# Patient Record
Sex: Male | Born: 1980 | Race: Black or African American | Hispanic: No | Marital: Married | Smoking: Never smoker
Health system: Southern US, Community
[De-identification: ages and names within clinical notes are randomized; demographics above are authoritative.]

---

## 2018-02-03 DIAGNOSIS — R0789 Other chest pain: Secondary | ICD-10-CM | POA: Insufficient documentation

## 2018-02-03 DIAGNOSIS — R030 Elevated blood-pressure reading, without diagnosis of hypertension: Secondary | ICD-10-CM | POA: Insufficient documentation

## 2018-02-03 DIAGNOSIS — J45909 Unspecified asthma, uncomplicated: Secondary | ICD-10-CM | POA: Insufficient documentation

## 2018-04-28 DIAGNOSIS — R9431 Abnormal electrocardiogram [ECG] [EKG]: Secondary | ICD-10-CM | POA: Insufficient documentation

## 2018-04-28 DIAGNOSIS — E559 Vitamin D deficiency, unspecified: Secondary | ICD-10-CM | POA: Insufficient documentation

## 2018-04-28 DIAGNOSIS — E538 Deficiency of other specified B group vitamins: Secondary | ICD-10-CM | POA: Insufficient documentation

## 2018-04-28 DIAGNOSIS — R001 Bradycardia, unspecified: Secondary | ICD-10-CM | POA: Insufficient documentation

## 2019-10-24 ENCOUNTER — Other Ambulatory Visit: Payer: Self-pay | Admitting: Internal Medicine

## 2019-10-24 DIAGNOSIS — E0590 Thyrotoxicosis, unspecified without thyrotoxic crisis or storm: Secondary | ICD-10-CM

## 2019-11-02 ENCOUNTER — Ambulatory Visit
Admission: RE | Admit: 2019-11-02 | Discharge: 2019-11-02 | Disposition: A | Discharge status: Home / Self Care | Payer: 59 | Source: Ambulatory Visit | Attending: Internal Medicine | Admitting: Internal Medicine

## 2019-11-02 DIAGNOSIS — E0590 Thyrotoxicosis, unspecified without thyrotoxic crisis or storm: Secondary | ICD-10-CM

## 2019-12-28 ENCOUNTER — Other Ambulatory Visit: Payer: Self-pay

## 2019-12-28 ENCOUNTER — Encounter: Payer: Self-pay | Admitting: Endocrinology

## 2019-12-28 ENCOUNTER — Ambulatory Visit (INDEPENDENT_AMBULATORY_CARE_PROVIDER_SITE_OTHER): Payer: 59 | Admitting: Endocrinology

## 2019-12-28 DIAGNOSIS — E0590 Thyrotoxicosis, unspecified without thyrotoxic crisis or storm: Secondary | ICD-10-CM | POA: Diagnosis not present

## 2019-12-28 LAB — T4, FREE: Free T4: 0.89 ng/dL (ref 0.60–1.60)

## 2019-12-28 LAB — TSH: TSH: 0.58 u[IU]/mL (ref 0.35–4.50)

## 2019-12-28 NOTE — Progress Notes (Signed)
Subjective:    Patient ID: Brian Santana, male    DOB: 1980/04/21, 39 y.o.   MRN: [id number]  HPI Pt is referred by Dr. Corky Downs, for hyperthyroidism.  Pt reports he was dx'ed with hyperthyroidism in 2021.  He has never been on therapy for this.  He has never had XRT to the anterior neck, or thyroid surgery.   He does not consume kelp or any other non-prescribed thyroid medication.  He has never been on amiodarone.  He has lost 15 lbs x 6 mos--unintentional.  He also has frequent BM's.    Social History   Socioeconomic History  . Marital status: Married    Spouse name: Not on file  . Number of children: Not on file  . Years of education: Not on file  . Highest education level: Not on file  Occupational History  . Not on file  Tobacco Use  . Smoking status: Never Smoker  . Smokeless tobacco: Never Used  Substance and Sexual Activity  . Alcohol use: Not on file  . Drug use: Not on file  . Sexual activity: Not on file  Other Topics Concern  . Not on file  Social History Narrative  . Not on file   Social Determinants of Health   Financial Resource Strain:   . Difficulty of Paying Living Expenses: Not on file  Food Insecurity:   . Worried About Programme researcher, broadcasting/film/video in the Last Year: Not on file  . Ran Out of Food in the Last Year: Not on file  Transportation Needs:   . Lack of Transportation (Medical): Not on file  . Lack of Transportation (Non-Medical): Not on file  Physical Activity:   . Days of Exercise per Week: Not on file  . Minutes of Exercise per Session: Not on file  Stress:   . Feeling of Stress : Not on file  Social Connections:   . Frequency of Communication with Friends and Family: Not on file  . Frequency of Social Gatherings with Friends and Family: Not on file  . Attends Religious Services: Not on file  . Active Member of Clubs or Organizations: Not on file  . Attends Banker Meetings: Not on file  . Marital Status: Not on file  Intimate  Partner Violence:   . Fear of Current or Ex-Partner: Not on file  . Emotionally Abused: Not on file  . Physically Abused: Not on file  . Sexually Abused: Not on file    Current Outpatient Medications on File Prior to Visit  Medication Sig Dispense Refill  . Ascorbic Acid (VITAMIN C PO) Take by mouth daily.    Marland Kitchen VITAMIN D PO Take by mouth daily.     No current facility-administered medications on file prior to visit.    No Known Allergies  Family History  Problem Relation Age of Onset  . Thyroid disease Neg Hx     BP 138/88   Pulse 62   Ht 5' 10.5" (1.791 m)   Wt 188 lb (85.3 kg)   SpO2 99%   BMI 26.59 kg/m   Review of Systems denies palpitations, sob, excessive diaphoresis, tremor, anxiety, and heat intolerance.      Objective:   Physical Exam VS: see vs page GEN: no distress HEAD: head: no deformity eyes: no periorbital swelling, no proptosis external nose and ears are normal NECK: supple, thyroid is not enlarged CHEST WALL: no deformity LUNGS: clear to auscultation CV: reg rate and rhythm, no murmur.  MUSCULOSKELETAL: gait is normal and steady EXTEMITIES: no deformity.  no leg edema NEURO:  cn 2-12 grossly intact.   readily moves all 4's.  sensation is intact to touch on all 4's. Slight fine tremor of the hands.   SKIN:  Normal texture and temperature.  No rash or suspicious lesion is visible.  Not diaphoretic.   NODES:  None palpable at the neck PSYCH: alert, well-oriented.  Does not appear anxious nor depressed.    [place] (2021): Moderately heterogeneous, borderline enlarged and potentially mildly hyperemic thyroid without worrisome thyroid nodule or mass, nonspecific though could be seen in the setting of provided history of thyrotoxicosis.  I have reviewed outside records, and summarized: Pt was noted to have suppressed TSH, and referred here.  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  We discussed rx options.  He chooses tapazole   Patient Instructions  Blood tests are requested for you today.  We'll let you know about the results.  If it is overactive again. I'll prescirbe for you a medication to sloe it to normal If ever you have fever while taking methimazole, stop it and call us, even if the reason is obvious, because of the risk of a rare side-effect. It is best to never miss the medication.  However, if you do miss it, next best is to double up the next time.  Please come back for a follow-up appointment in 3 months.        Hyperthyroidism  Hyperthyroidism is when the thyroid gland is too active (overactive). The thyroid gland is a small gland located in the lower front part of the neck, just in front of the windpipe (trachea). This gland makes hormones that help control how the body uses food for energy (metabolism) as well as how the heart and brain function. These hormones also play a role in keeping your bones strong. When the thyroid is overactive, it produces too much of a hormone called thyroxine. What are the causes? This condition may be caused by:  Graves' disease. This is a disorder in which the body's disease-fighting system (immune system) attacks the thyroid gland. This is the most common cause.  Inflammation of the thyroid gland.  A tumor in the thyroid gland.  Use of certain medicines, including: ? Prescription thyroid hormone replacement. ? Herbal supplements that mimic thyroid hormones. ? Amiodarone therapy.  Solid or fluid-filled lumps within your thyroid gland (thyroid nodules).  Taking in a large amount of iodine from foods or medicines. What increases the risk? You are more likely to develop this condition if:  You are male.  You have a family history of thyroid conditions.  You smoke tobacco.  You use a medicine called lithium.  You take medicines that affect the immune system  (immunosuppressants). What are the signs or symptoms? Symptoms of this condition include:  Nervousness.  Inability to tolerate heat.  Unexplained weight loss.  Diarrhea.  Change in the texture of hair or skin.  Heart skipping beats or making extra beats.  Rapid heart rate.  Loss of menstruation.  Shaky hands.  Fatigue.  Restlessness.  Sleep problems.  Enlarged thyroid gland or a lump in the thyroid (nodule). You may also have symptoms of Graves' disease, which may include:  Protruding eyes.  Dry eyes.  Red or swollen eyes.  Problems with vision. How is this diagnosed? This condition  may be diagnosed based on:  Your symptoms and medical history.  A physical exam.  Blood tests.  Thyroid ultrasound. This test involves using sound waves to produce images of the thyroid gland.  A thyroid scan. A radioactive substance is injected into a vein, and images show how much iodine is present in the thyroid.  Radioactive iodine uptake test (RAIU). A small amount of radioactive iodine is given by mouth to see how much iodine the thyroid absorbs after a certain amount of time. How is this treated? Treatment depends on the cause and severity of the condition. Treatment may include:  Medicines to reduce the amount of thyroid hormone your body makes.  Radioactive iodine treatment (radioiodine therapy). This involves swallowing a small dose of radioactive iodine, in capsule or liquid form, to kill thyroid cells.  Surgery to remove part or all of your thyroid gland. You may need to take thyroid hormone replacement medicine for the rest of your life after thyroid surgery.  Medicines to help manage your symptoms. Follow these instructions at home:   Take over-the-counter and prescription medicines only as told by your health care provider.  Do not use any products that contain nicotine or tobacco, such as cigarettes and e-cigarettes. If you need help quitting, ask your  health care provider.  Follow any instructions from your health care provider about diet. You may be instructed to limit foods that contain iodine.  Keep all follow-up visits as told by your health care provider. This is important. ? You will need to have blood tests regularly so that your health care provider can monitor your condition. Contact a health care provider if:  Your symptoms do not get better with treatment.  You have a fever.  You are taking thyroid hormone replacement medicine and you: ? Have symptoms of depression. ? Feel like you are tired all the time. ? Gain weight. Get help right away if:  You have chest pain.  You have decreased alertness or a change in your awareness.  You have abdominal pain.  You feel dizzy.  You have a rapid heartbeat.  You have an irregular heartbeat.  You have difficulty breathing. Summary  The thyroid gland is a small gland located in the lower front part of the neck, just in front of the windpipe (trachea).  Hyperthyroidism is when the thyroid gland is too active (overactive) and produces too much of a hormone called thyroxine.  The most common cause is Graves' disease, a disorder in which your immune system attacks the thyroid gland.  Hyperthyroidism can cause various symptoms, such as unexplained weight loss, nervousness, inability to tolerate heat, or changes in your heartbeat.  Treatment may include medicine to reduce the amount of thyroid hormone your body makes, radioiodine therapy, surgery, or medicines to manage symptoms. This information is not intended to replace advice given to you by your health care provider. Make sure you discuss any questions you have with your health care provider. Document Revised: 02/04/2017 Document Reviewed: 02/02/2017 Elsevier Patient Education  2020 [employer].

## 2019-12-28 NOTE — Patient Instructions (Addendum)
Blood tests are requested for you today.  We'll let you know about the results.  If it is overactive again. I'll prescirbe for you a medication to sloe it to normal If ever you have fever while taking methimazole, stop it and call us, even if the reason is obvious, because of the risk of a rare side-effect. It is best to never miss the medication.  However, if you do miss it, next best is to double up the next time.  Please come back for a follow-up appointment in 3 months.        Hyperthyroidism  Hyperthyroidism is when the thyroid gland is too active (overactive). The thyroid gland is a small gland located in the lower front part of the neck, just in front of the windpipe (trachea). This gland makes hormones that help control how the body uses food for energy (metabolism) as well as how the heart and brain function. These hormones also play a role in keeping your bones strong. When the thyroid is overactive, it produces too much of a hormone called thyroxine. What are the causes? This condition may be caused by:  Graves' disease. This is a disorder in which the body's disease-fighting system (immune system) attacks the thyroid gland. This is the most common cause.  Inflammation of the thyroid gland.  A tumor in the thyroid gland.  Use of certain medicines, including: ? Prescription thyroid hormone replacement. ? Herbal supplements that mimic thyroid hormones. ? Amiodarone therapy.  Solid or fluid-filled lumps within your thyroid gland (thyroid nodules).  Taking in a large amount of iodine from foods or medicines. What increases the risk? You are more likely to develop this condition if:  You are male.  You have a family history of thyroid conditions.  You smoke tobacco.  You use a medicine called lithium.  You take medicines that affect the immune system (immunosuppressants). What are the signs or symptoms? Symptoms of this condition  include:  Nervousness.  Inability to tolerate heat.  Unexplained weight loss.  Diarrhea.  Change in the texture of hair or skin.  Heart skipping beats or making extra beats.  Rapid heart rate.  Loss of menstruation.  Shaky hands.  Fatigue.  Restlessness.  Sleep problems.  Enlarged thyroid gland or a lump in the thyroid (nodule). You may also have symptoms of Graves' disease, which may include:  Protruding eyes.  Dry eyes.  Red or swollen eyes.  Problems with vision. How is this diagnosed? This condition may be diagnosed based on:  Your symptoms and medical history.  A physical exam.  Blood tests.  Thyroid ultrasound. This test involves using sound waves to produce images of the thyroid gland.  A thyroid scan. A radioactive substance is injected into a vein, and images show how much iodine is present in the thyroid.  Radioactive iodine uptake test (RAIU). A small amount of radioactive iodine is given by mouth to see how much iodine the thyroid absorbs after a certain amount of time. How is this treated? Treatment depends on the cause and severity of the condition. Treatment may include:  Medicines to reduce the amount of thyroid hormone your body makes.  Radioactive iodine treatment (radioiodine therapy). This involves swallowing a small dose of radioactive iodine, in capsule or liquid form, to kill thyroid cells.  Surgery to remove part or all of your thyroid gland. You may need to take thyroid hormone replacement medicine for the rest of your life after thyroid surgery.  Medicines to help  manage your symptoms. Follow these instructions at home:   Take over-the-counter and prescription medicines only as told by your health care provider.  Do not use any products that contain nicotine or tobacco, such as cigarettes and e-cigarettes. If you need help quitting, ask your health care provider.  Follow any instructions from your health care provider about  diet. You may be instructed to limit foods that contain iodine.  Keep all follow-up visits as told by your health care provider. This is important. ? You will need to have blood tests regularly so that your health care provider can monitor your condition. Contact a health care provider if:  Your symptoms do not get better with treatment.  You have a fever.  You are taking thyroid hormone replacement medicine and you: ? Have symptoms of depression. ? Feel like you are tired all the time. ? Gain weight. Get help right away if:  You have chest pain.  You have decreased alertness or a change in your awareness.  You have abdominal pain.  You feel dizzy.  You have a rapid heartbeat.  You have an irregular heartbeat.  You have difficulty breathing. Summary  The thyroid gland is a small gland located in the lower front part of the neck, just in front of the windpipe (trachea).  Hyperthyroidism is when the thyroid gland is too active (overactive) and produces too much of a hormone called thyroxine.  The most common cause is Graves' disease, a disorder in which your immune system attacks the thyroid gland.  Hyperthyroidism can cause various symptoms, such as unexplained weight loss, nervousness, inability to tolerate heat, or changes in your heartbeat.  Treatment may include medicine to reduce the amount of thyroid hormone your body makes, radioiodine therapy, surgery, or medicines to manage symptoms. This information is not intended to replace advice given to you by your health care provider. Make sure you discuss any questions you have with your health care provider. Document Revised: 02/04/2017 Document Reviewed: 02/02/2017 Elsevier Patient Education  2020 [employer].

## 2019-12-31 ENCOUNTER — Telehealth: Payer: Self-pay

## 2019-12-31 NOTE — Telephone Encounter (Signed)
Informed patient of results and recommendations. 

## 2019-12-31 NOTE — Telephone Encounter (Signed)
-----   Message from Romero Belling, MD sent at 12/28/2019  5:53 PM EDT ----- please contact patient: Thyroid is back to normal, so we can hold off on the pill for now.  I'll see you next time.

## 2020-04-11 ENCOUNTER — Ambulatory Visit: Payer: 59 | Admitting: Endocrinology

## 2021-11-22 IMAGING — US US THYROID
1 series · 13 of 25 positions shown · non-contrast
Comparison: None.

CLINICAL DATA: Other.  Thyrotoxicosis without thyroid storm.

EXAM:
THYROID ULTRASOUND
TECHNIQUE: Ultrasound examination of the thyroid gland and adjacent soft
tissues was performed.

[Series 1: us thyroid · 0.05mm/px · 13 of 62 slices shown]
[im 1/62]
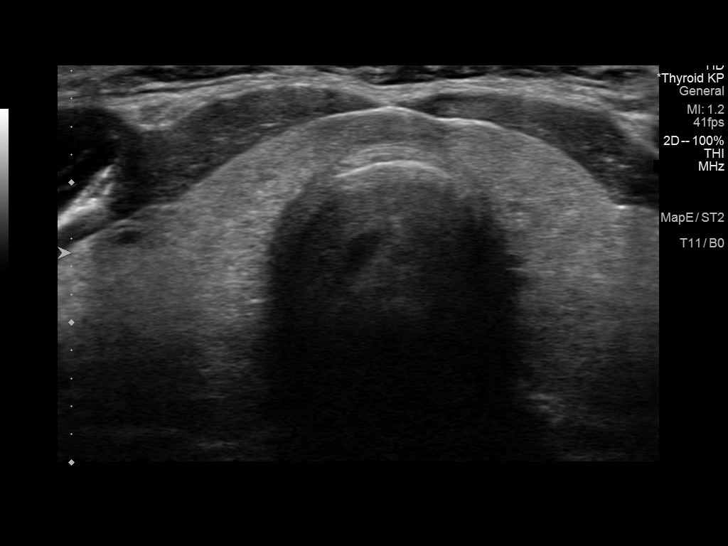
[im 6/62]
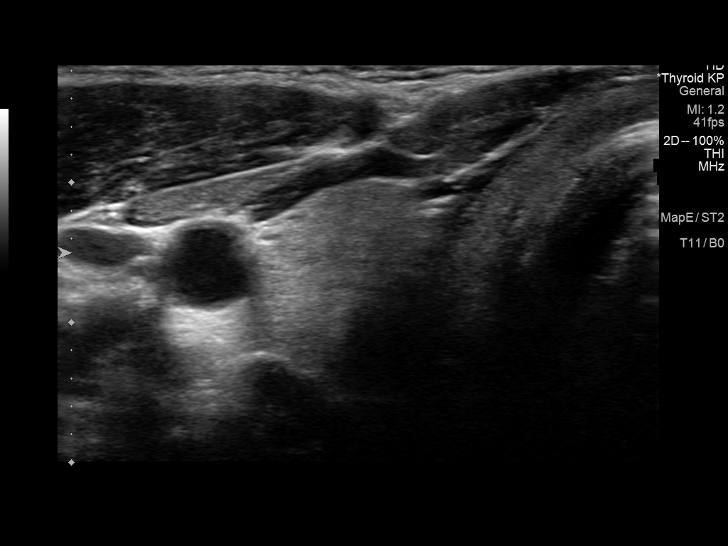
[im 11/62]
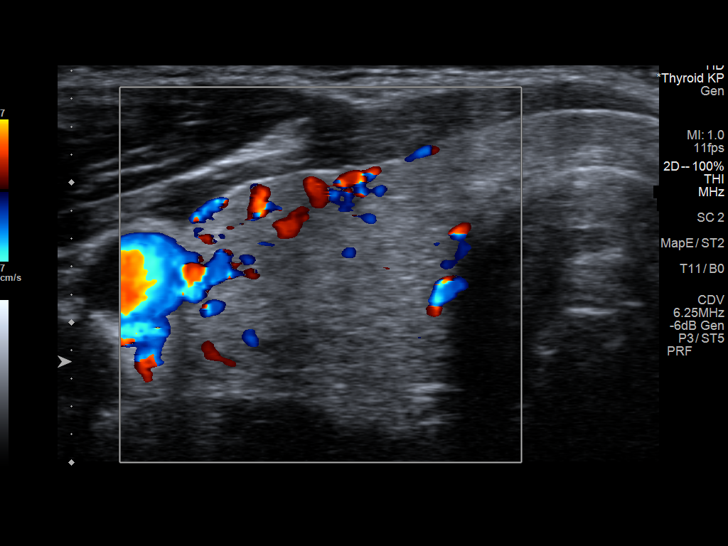
[im 16/62]
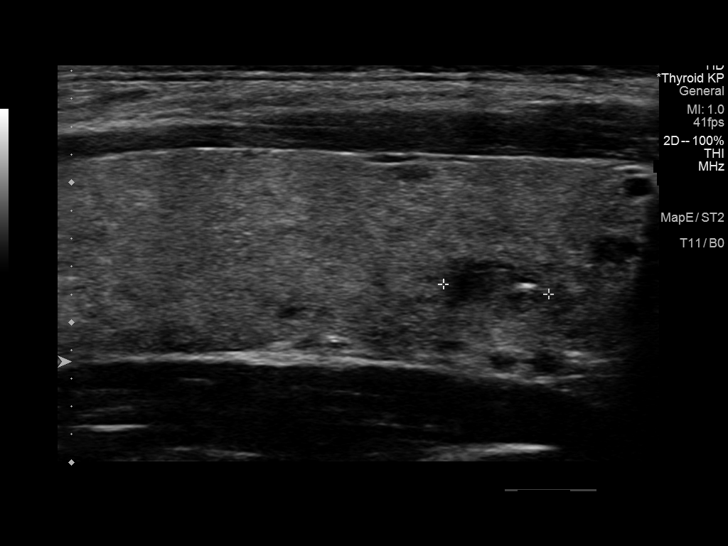
[im 21/62]
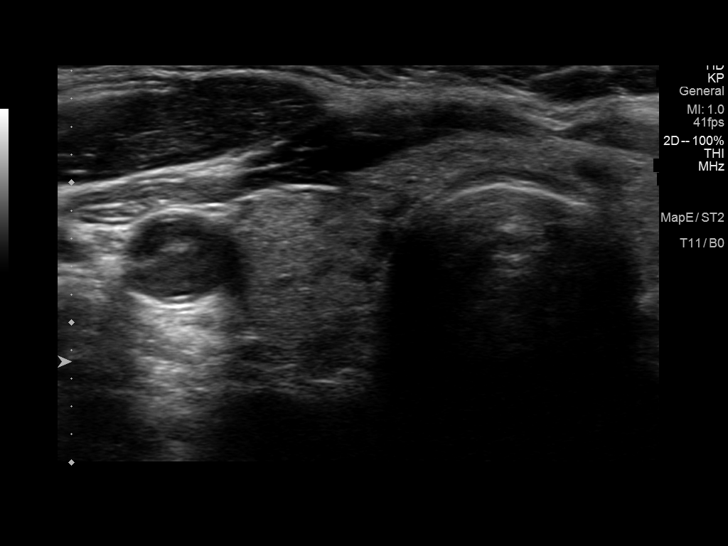
[im 26/62]
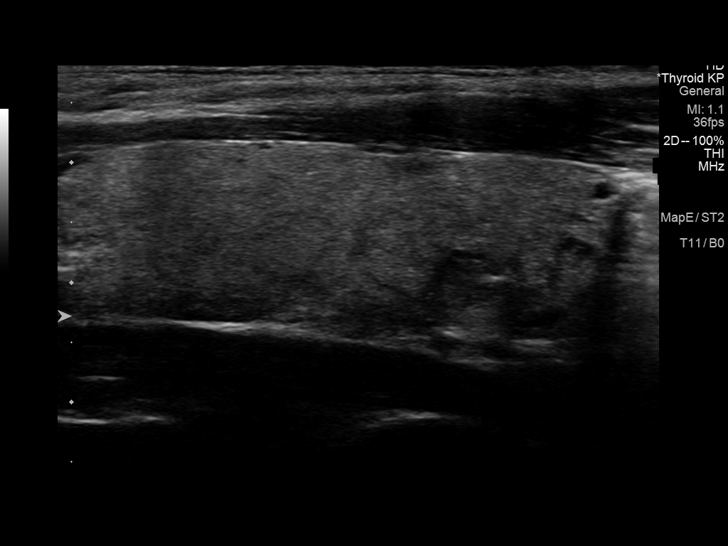
[im 31/62]
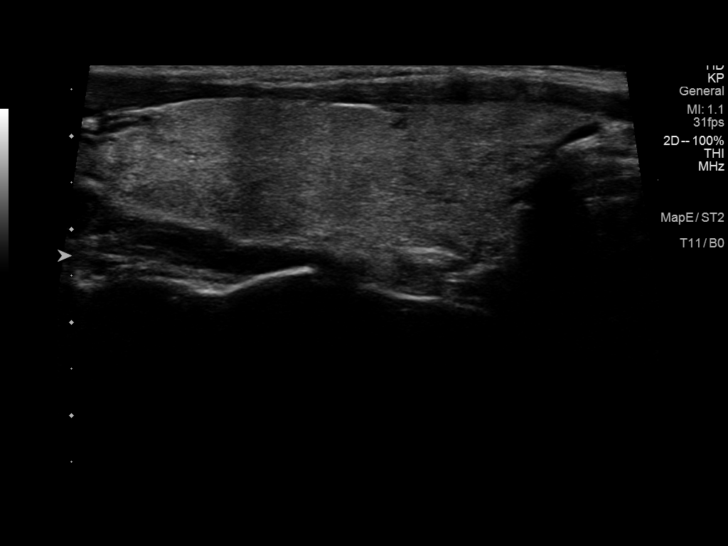
[im 36/62]
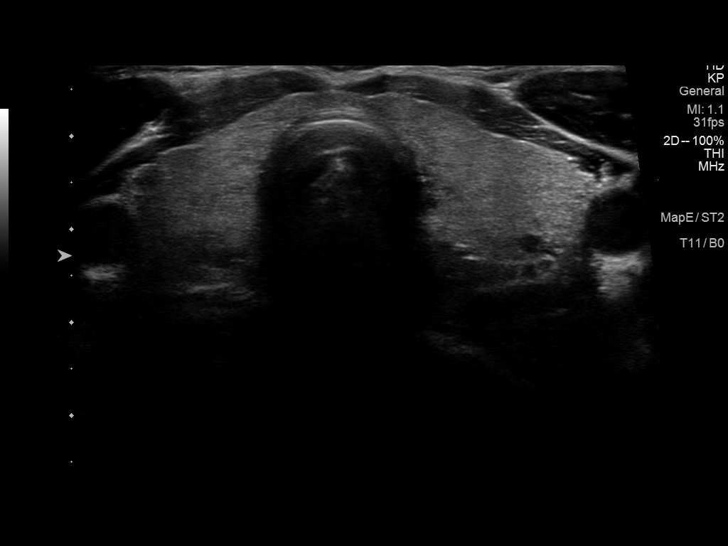
[im 41/62]
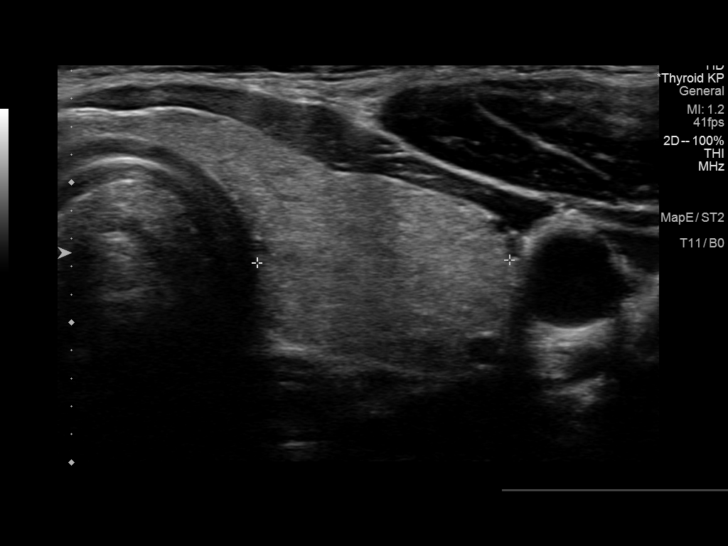
[im 46/62]
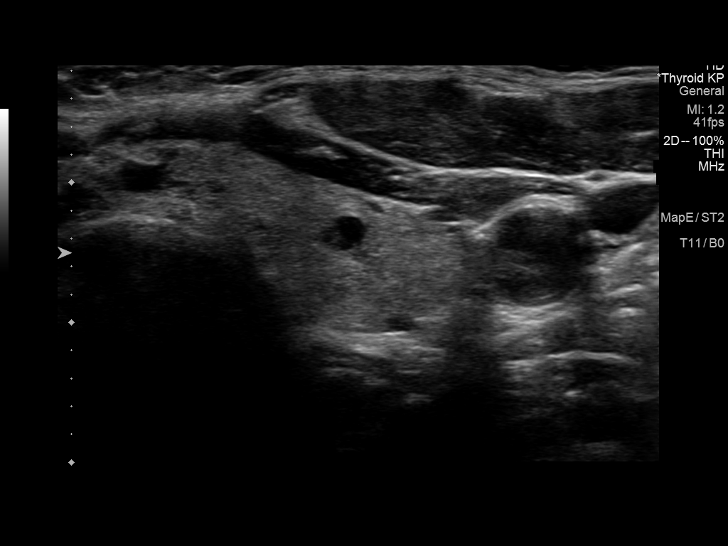
[im 51/62]
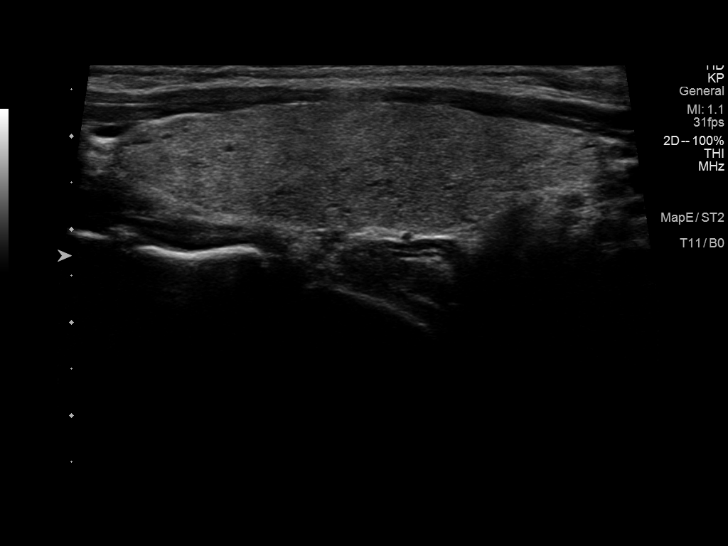
[im 56/62]
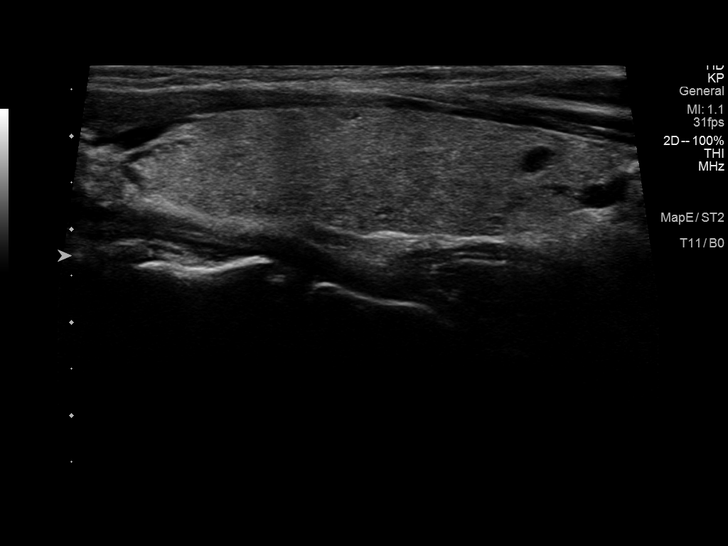
[im 62/62]
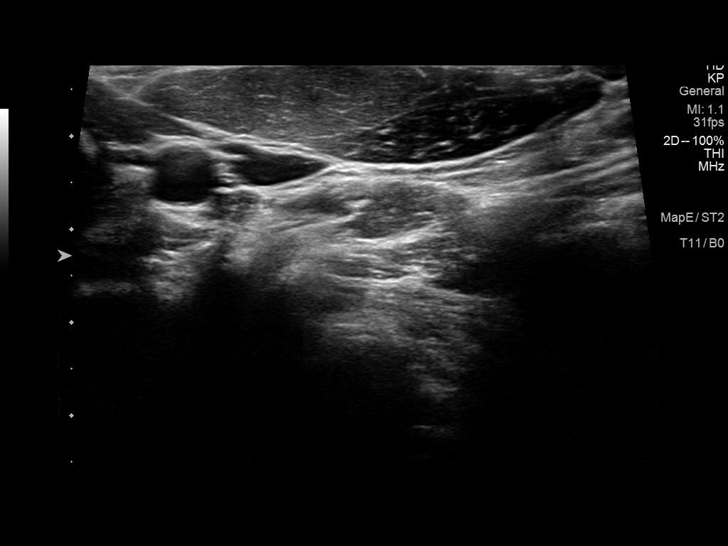

[13 of 25 positions shown; findings below may reference images not displayed]

FINDINGS: Parenchymal Echotexture: Moderately heterogenous - suspected mild
diffuse glandular hyperemia

Isthmus: Normal in size measuring 0.3 cm in diameter

Right lobe: Borderline enlarged measuring 5.5 x 1.5 x 2.0 cm

Left lobe: Borderline enlarged measuring 5.3 x 1.3 x 1.8 cm

_________________________________________________________

Estimated total number of nodules >/= 1 cm: 0

Number of spongiform nodules >/=  2 cm not described below (TR1): 0

Number of mixed cystic and solid nodules >/= 1.5 cm not described
below (TR2): 0

_________________________________________________________

There is a punctate (approximately 0.8 x 0.4 x 0.4 cm) hypoechoic
nodule within the inferior pole right lobe of the thyroid which
contains an internal echogenic foci ring down artifact compatible
colloid. This benign colloid containing nodule does not meet
criteria to recommend percutaneous sampling or continued dedicated
follow-up.

Scattered punctate (sub 3 mm) hypoechoic nodules and anechoic cysts
within the thyroid do not meet imaging criteria to recommend
percutaneous sampling or continued dedicated follow-up.
IMPRESSION: Moderately heterogeneous, borderline enlarged and potentially mildly
hyperemic thyroid without worrisome thyroid nodule or mass,
nonspecific though could be seen in the setting of provided history
of thyrotoxicosis.

The above is in keeping with the ACR TI-RADS recommendations - [HOSPITAL] 3439;[DATE].

## 2023-07-28 ENCOUNTER — Ambulatory Visit (INDEPENDENT_AMBULATORY_CARE_PROVIDER_SITE_OTHER): Payer: Self-pay | Admitting: Podiatry

## 2023-07-28 ENCOUNTER — Ambulatory Visit (INDEPENDENT_AMBULATORY_CARE_PROVIDER_SITE_OTHER)

## 2023-07-28 ENCOUNTER — Encounter: Payer: Self-pay | Admitting: Podiatry

## 2023-07-28 VITALS — Ht 70.5 in | Wt 188.0 lb

## 2023-07-28 DIAGNOSIS — M7752 Other enthesopathy of left foot: Secondary | ICD-10-CM | POA: Diagnosis not present

## 2023-07-28 DIAGNOSIS — M722 Plantar fascial fibromatosis: Secondary | ICD-10-CM | POA: Diagnosis not present

## 2023-07-28 MED ORDER — DICLOFENAC SODIUM 75 MG PO TBEC: 75.0000 mg | DELAYED_RELEASE_TABLET | Freq: Two times a day (BID) | ORAL | 2 refills | Status: AC

## 2023-07-28 MED ORDER — TRIAMCINOLONE ACETONIDE 10 MG/ML IJ SUSP
10.0000 mg | Freq: Once | INTRAMUSCULAR | Status: AC
  Administered 2023-07-28: 10 mg via INTRA_ARTICULAR

## 2023-07-28 NOTE — Patient Instructions (Signed)

## 2023-07-29 NOTE — Progress Notes (Signed)
 Subjective:   Patient ID: Brian Santana, male   DOB: 43 y.o.   MRN: [id number]   HPI Patient presents stating has a lot of pain in the bottom of the left heel that is been there for around a month worse when getting up in the morning after walking.  Also has some concerns about discoloration right big toenail does not smoke likes to be active   Review of Systems  All other systems reviewed and are negative.       Objective:  Physical Exam Vitals and nursing note reviewed.  Constitutional:      Appearance: He is well-developed.  Pulmonary:     Effort: Pulmonary effort is normal.  Musculoskeletal:        General: Normal range of motion.  Skin:    General: Skin is warm.  Neurological:     Mental Status: He is alert.     Neurovascular status intact muscle strength adequate range of motion adequate exquisite discomfort medial fascial band left at the insertional point tendon calcaneus inflammation fluid around the medial band with slight discoloration right hallux nail bed     Assessment:  Acute Antar fasciitis left inflammation fluid with slight nail changes right     Plan:  H&P both conditions reviewed organ to focus on the fact I do not see treatment for a small amount of discoloration right hallux nail.  Sterile prep injected the fascia at insertion 3 mg Kenalog 5 mg Xylocaine applied fascial brace properly fitted to lower arch to fit and support and elevate the arch.  Reappoint 3 weeks to recheck placed on diclofenac 75 mg twice daily  X-rays indicate small spur no indication stress fracture arthritis

## 2024-01-23 ENCOUNTER — Ambulatory Visit: Admitting: Podiatry

## 2024-01-23 ENCOUNTER — Encounter: Payer: Self-pay | Admitting: Podiatry

## 2024-01-23 DIAGNOSIS — M722 Plantar fascial fibromatosis: Secondary | ICD-10-CM

## 2024-01-23 MED ORDER — TRIAMCINOLONE ACETONIDE 10 MG/ML IJ SUSP
10.0000 mg | Freq: Once | INTRAMUSCULAR | Status: AC
  Administered 2024-01-23: 10 mg via INTRA_ARTICULAR

## 2024-01-23 NOTE — Progress Notes (Signed)
 Subjective:   Patient ID: Brian Santana, male   DOB: 43 y.o.   MRN: [id number]   HPI Patient states that the left tendon has started to bother him more and he did do well for several months but over the last month or 2 it has become bad again   ROS      Objective:  Physical Exam  Neurovascular status intact with exquisite discomfort in the medial fascial band left at the insertional point of the calcaneus with a barrel heel and moderate flatfoot deformity     Assessment:  Acute reoccurrence of plantar fasciitis left with inflammation fluid of the medial band and moderate depression of the arch     Plan:  H&P reviewed and I have recommended trying to get a little more aggressive from a conservative perspective and I did go ahead sterile prep reinjected the fascia 3 mg Kenalog  5 mg Xylocaine and dispensed night splint with all instructions on usage and ice therapy.  I then went ahead and I did cast him for functional orthotic devices due to the flattening of his arch to support the arch and patient had evaluation from pedorthist on this particular condition.  Will be seen back by pedorthist when orthotics returned

## 2024-02-15 ENCOUNTER — Ambulatory Visit

## 2024-02-15 DIAGNOSIS — M722 Plantar fascial fibromatosis: Secondary | ICD-10-CM

## 2024-02-15 DIAGNOSIS — M7752 Other enthesopathy of left foot: Secondary | ICD-10-CM

## 2024-02-15 NOTE — Progress Notes (Signed)
 ORTHOTIC DISPENSING:   Reason for Visit:         Fitting and Delivery of Custom Fabricated Foot Orthoses Patient Report:            Patient reports comfort and is satisfied with device.   OBJECTIVE DATA: Patient History / Diagnosis:    No change in pathology Provided Device:                     Functional foot orthoses   GOAL OF ORTHOSIS - Improve gait - Decrease energy expenditure - Improve Balance - Provide Triplanar stability of foot complex - Facilitate motion   ACTIONS PERFORMED Patient was fit with custom foot orthoses   Patient was provided with verbal and written instruction and demonstration regarding wear, care, proper fit, function, and use of the orthosis.    Patient was also provided with verbal instruction regarding how to report any failures or malfunctions of the orthosis and necessary follow up care. Patient was also instructed to contact our office regarding any change in status that may affect the function of the orthosis.   Patient demonstrated understanding of all instructions.
